# Patient Record
Sex: Male | Born: 1990 | Hispanic: Yes | Marital: Single | Smoking: Never smoker
Health system: Southern US, Community
[De-identification: ages and names within clinical notes are randomized; demographics above are authoritative.]

---
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
## 2015-02-27 ENCOUNTER — Emergency Department: Payer: Self-pay | Admitting: Emergency Medicine

## 2023-04-08 ENCOUNTER — Ambulatory Visit
Admission: EM | Admit: 2023-04-08 | Discharge: 2023-04-08 | Disposition: A | Discharge status: Home / Self Care | Payer: Medicaid Other | Attending: Urgent Care | Admitting: Urgent Care

## 2023-04-08 DIAGNOSIS — R040 Epistaxis: Secondary | ICD-10-CM | POA: Diagnosis not present

## 2023-04-08 DIAGNOSIS — R1011 Right upper quadrant pain: Secondary | ICD-10-CM | POA: Diagnosis not present

## 2023-04-08 DIAGNOSIS — F1010 Alcohol abuse, uncomplicated: Secondary | ICD-10-CM | POA: Diagnosis not present

## 2023-04-08 DIAGNOSIS — G8929 Other chronic pain: Secondary | ICD-10-CM | POA: Diagnosis not present

## 2023-04-08 LAB — POCT URINALYSIS DIP (MANUAL ENTRY)
Bilirubin, UA: NEGATIVE
Blood, UA: NEGATIVE
Glucose, UA: NEGATIVE mg/dL
Ketones, POC UA: NEGATIVE mg/dL
Leukocytes, UA: NEGATIVE
Nitrite, UA: NEGATIVE
Spec Grav, UA: 1.02 (ref 1.010–1.025)
Urobilinogen, UA: 1 E.U./dL
pH, UA: 7.5 (ref 5.0–8.0)

## 2023-04-08 MED ORDER — OXYMETAZOLINE HCL 0.05 % NA SOLN
2.0000 | Freq: Two times a day (BID) | NASAL | Status: DC
  Administered 2023-04-08: 2 via NASAL

## 2023-04-08 NOTE — ED Provider Notes (Signed)
Renaldo Fiddler    CSN: [id number] Arrival date & time: 04/08/23  0848      History   Chief Complaint Chief Complaint  Patient presents with   Nasal Congestion    HPI Johnathan Wallace is a 32 y.o. male.   Pleasant 32 year old male presents today with primary concern of recurrent nosebleeds.  He states since Wednesday of this past week he has noticed a right-sided nosebleed several times throughout the day, but much worse at work or with lifting and bending forward.  He states that will bleed for roughly 30 to 45 seconds and then quit after applying pressure.  He does admit to a history of cocaine use, last snorted on Tuesday.  He states he has snorted cocaine for a long time, and never had nosebleeds on it before.  He has not tried any over-the-counter medications for his symptoms.  He denies sinus pain or fever. Additionally, patient wants to discuss some right upper quadrant pain that he has been experiencing for 5 to 6 months.  States that he was seen 6 months ago for back pain, and was told it was a muscle issue.  States the back pain went away, but now it is in the right upper quadrant on a consistent basis.  He reports it as a dull nagging pain.  He has not had this further evaluated.  He denies worsening discomfort with eating, and denies nausea or vomiting.  He does admit to excessive alcohol intake and drinks 6-8 beers routinely.  He does have tattoos.  He denies any hepatitis C screening in the past.  He denies any current jaundice but states he feels like he has seen yellow in his skin in the past.  He does not have a primary care physician and would like to get set up with 1.     History reviewed. No pertinent past medical history.  There are no problems to display for this patient.   History reviewed. No pertinent surgical history.     Home Medications    Prior to Admission medications   Not on File    Family History History reviewed. No pertinent family  history.  Social History Social History   Tobacco Use   Smoking status: Never   Smokeless tobacco: Never  Vaping Use   Vaping Use: Some days  Substance Use Topics   Alcohol use: Yes   Drug use: Yes    Types: Marijuana, Cocaine     Allergies   Patient has no known allergies.   Review of Systems Review of Systems  HENT:  Positive for nosebleeds.   Gastrointestinal:  Positive for abdominal pain (RUQ).  All other systems reviewed and are negative.    Physical Exam Triage Vital Signs ED Triage Vitals  Enc Vitals Group     BP 04/08/23 0900 123/79     Pulse Rate 04/08/23 0900 84     Resp 04/08/23 0900 18     Temp 04/08/23 0900 97.6 F (36.4 C)     Temp src --      SpO2 04/08/23 0900 97 %     Weight --      Height --      Head Circumference --      Peak Flow --      Pain Score 04/08/23 0858 0     Pain Loc --      Pain Edu? --      Excl. in GC? --  No data found.  Updated Vital Signs BP 123/79   Pulse 84   Temp 97.6 F (36.4 C)   Resp 18   SpO2 97%   Visual Acuity Right Eye Distance:   Left Eye Distance:   Bilateral Distance:    Right Eye Near:   Left Eye Near:    Bilateral Near:     Physical Exam Vitals and nursing note reviewed.  Constitutional:      General: He is not in acute distress.    Appearance: Normal appearance. He is well-developed. He is not ill-appearing, toxic-appearing or diaphoretic.  HENT:     Head: Normocephalic and atraumatic.     Right Ear: Tympanic membrane, ear canal and external ear normal.     Left Ear: Tympanic membrane, ear canal and external ear normal.     Nose: Mucosal edema present. No nasal deformity or septal deviation.     Right Nostril: Epistaxis present. No septal hematoma or occlusion.     Left Nostril: No epistaxis, septal hematoma or occlusion.     Right Turbinates: Enlarged and swollen.     Left Turbinates: Not enlarged or swollen.     Right Sinus: No maxillary sinus tenderness or frontal sinus  tenderness.     Left Sinus: No maxillary sinus tenderness or frontal sinus tenderness.     Mouth/Throat:     Lips: Pink.     Mouth: Mucous membranes are moist. No oral lesions.     Pharynx: Oropharynx is clear. Uvula midline. No posterior oropharyngeal erythema.  Eyes:     Conjunctiva/sclera: Conjunctivae normal.  Cardiovascular:     Rate and Rhythm: Normal rate and regular rhythm.     Heart sounds: No murmur heard. Pulmonary:     Effort: Pulmonary effort is normal. No respiratory distress.     Breath sounds: Normal breath sounds. No stridor. No wheezing, rhonchi or rales.  Abdominal:     General: Abdomen is flat. Bowel sounds are normal. There is no distension.     Palpations: Abdomen is soft.     Tenderness: There is no abdominal tenderness. There is no right CVA tenderness, left CVA tenderness, guarding or rebound.     Comments: Negative for hepatomegaly Negative splenic percussion sign  Musculoskeletal:        General: No swelling.     Cervical back: Normal range of motion and neck supple. No rigidity or tenderness.  Lymphadenopathy:     Cervical: No cervical adenopathy.  Skin:    General: Skin is warm and dry.     Capillary Refill: Capillary refill takes less than 2 seconds.     Findings: No erythema or rash.  Neurological:     General: No focal deficit present.     Mental Status: He is alert and oriented to person, place, and time.  Psychiatric:        Mood and Affect: Mood normal.      UC Treatments / Results  Labs (all labs ordered are listed, but only abnormal results are displayed) Labs Reviewed  POCT URINALYSIS DIP (MANUAL ENTRY) - Abnormal; Notable for the following components:      Result Value   Protein Ur, POC trace (*)    All other components within normal limits  LIPASE  COMPREHENSIVE METABOLIC PANEL  CBC WITH DIFFERENTIAL/PLATELET    EKG   Radiology No results found.  Procedures Procedures (including critical care time)  Medications  Ordered in UC Medications - No data to display   Initial Impression /  Assessment and Plan / UC Course  I have reviewed the triage vital signs and the nursing notes.  Pertinent labs & imaging results that were available during my care of the patient were reviewed by me and considered in my medical decision making (see chart for details).     Epistaxis - pt had oxymetazoline administered in our office, with complete resolution to the nosebleed.  We did discuss his continued illicit drug use as a likely culprit to his symptoms.  He states he does have a desire to quit, and has been clean for 4 days.  I offered him referral to substance abuse center which he declined Chronic RUQ pain - UA unremarkable in clinic. Tried to obtain hepatitis A/B/C testing but do not have the appropriate supplies in UC to obtain this. Recommended he establish with a PCP and have this test completed. CBC, CMP and lipase obtained today. No acute abdominal findings on exam to warrant ER eval. Alcohol abuse - pt admits to heavy ETOH intake. Discussed concern for cause of his RUQ pain/ liver issues. Pt willing to try and cut back, deferred referral to substance abuse clinic.   Final Clinical Impressions(s) / UC Diagnoses   Final diagnoses:  Epistaxis  Chronic RUQ pain  Alcohol abuse     Discharge Instructions      Please continue to use the oxymetazoline nasal spray provided, no more than 2 doses in 24 hours.  Do not exceed 6 sprays daily.  Please also do not exceed 3 consecutive days of use.  Regarding your right upper quadrant pain, we drew labs to assess the cause of this today.  I strongly encourage you to establish care with a primary care physician to have this worked up outpatient.  Please try to cut back on your alcohol intake as this is likely contributing to your symptoms. Try to quit any illicit drug use. If you cannot do this independently, discuss with your PCP who can get you to a clinic for  assistance.      ED Prescriptions   None    PDMP not reviewed this encounter.   Maretta Bees, [place] 04/08/23 [phone]

## 2023-04-08 NOTE — ED Triage Notes (Addendum)
Patient to Urgent Care with complaints of nasal congestion/ nose bleeds/ sinus pain and pressure. Symptoms started Wednesday.   States he has also been having right sided nose bleeds- reports the onset is usually when he is leaning forward. Duration is approx 30-45 seconds. Reports he was also snorting cocaine- last use Tuesday.

## 2023-04-08 NOTE — Discharge Instructions (Addendum)
Please continue to use the oxymetazoline nasal spray provided, no more than 2 doses in 24 hours.  Do not exceed 6 sprays daily.  Please also do not exceed 3 consecutive days of use.  Regarding your right upper quadrant pain, we drew labs to assess the cause of this today.  I strongly encourage you to establish care with a primary care physician to have this worked up outpatient.  Please try to cut back on your alcohol intake as this is likely contributing to your symptoms. Try to quit any illicit drug use. If you cannot do this independently, discuss with your PCP who can get you to a clinic for assistance.

## 2023-04-08 NOTE — ED Notes (Signed)
Primary care appointment made for patient to establish PCP. Appointment scheduled for Tuesday May 23, 2023 @ 1pm at [facility] at [employer].

## 2023-04-10 LAB — CBC WITH DIFFERENTIAL/PLATELET
Basophils Absolute: 0 10*3/uL (ref 0.0–0.2)
Basos: 1 %
EOS (ABSOLUTE): 0.2 10*3/uL (ref 0.0–0.4)
Eos: 3 %
Hematocrit: 44.8 % (ref 37.5–51.0)
Hemoglobin: 15.5 g/dL (ref 13.0–17.7)
Immature Grans (Abs): 0 10*3/uL (ref 0.0–0.1)
Immature Granulocytes: 0 %
Lymphocytes Absolute: 1.9 10*3/uL (ref 0.7–3.1)
Lymphs: 28 %
MCH: 33.3 pg — ABNORMAL HIGH (ref 26.6–33.0)
MCHC: 34.6 g/dL (ref 31.5–35.7)
MCV: 96 fL (ref 79–97)
Monocytes Absolute: 0.5 10*3/uL (ref 0.1–0.9)
Monocytes: 7 %
Neutrophils Absolute: 4.1 10*3/uL (ref 1.4–7.0)
Neutrophils: 61 %
Platelets: 293 10*3/uL (ref 150–450)
RBC: 4.65 x10E6/uL (ref 4.14–5.80)
RDW: 12.1 % (ref 11.6–15.4)
WBC: 6.7 10*3/uL (ref 3.4–10.8)

## 2023-04-10 LAB — COMPREHENSIVE METABOLIC PANEL
ALT: 17 IU/L (ref 0–44)
AST: 26 IU/L (ref 0–40)
Albumin/Globulin Ratio: 1.7 (ref 1.2–2.2)
Albumin: 4.6 g/dL (ref 4.3–5.2)
Alkaline Phosphatase: 85 IU/L (ref 44–121)
BUN/Creatinine Ratio: 14 (ref 9–20)
BUN: 13 mg/dL (ref 6–20)
Bilirubin Total: 0.4 mg/dL (ref 0.0–1.2)
CO2: 18 mmol/L — ABNORMAL LOW (ref 20–29)
Calcium: 9.4 mg/dL (ref 8.7–10.2)
Chloride: 104 mmol/L (ref 96–106)
Creatinine, Ser: 0.95 mg/dL (ref 0.76–1.27)
Globulin, Total: 2.7 g/dL (ref 1.5–4.5)
Glucose: 127 mg/dL — ABNORMAL HIGH (ref 70–99)
Potassium: 4.2 mmol/L (ref 3.5–5.2)
Sodium: 142 mmol/L (ref 134–144)
Total Protein: 7.3 g/dL (ref 6.0–8.5)
eGFR: 111 mL/min/{1.73_m2} (ref >59)

## 2023-04-10 LAB — LIPASE: Lipase: 27 U/L (ref 13–78)

## 2023-05-23 ENCOUNTER — Ambulatory Visit: Payer: Medicaid Other | Admitting: Nurse Practitioner

## 2023-07-04 ENCOUNTER — Ambulatory Visit: Payer: Medicaid Other | Admitting: Nurse Practitioner
